# Patient Record
Sex: Female | Born: 1970 | Race: Black or African American | Hispanic: No | Marital: Single | Smoking: Never smoker
Health system: Southern US, Community
[De-identification: ages and names within clinical notes are randomized; demographics above are authoritative.]

## PROBLEM LIST (undated history)

## (undated) DIAGNOSIS — E785 Hyperlipidemia, unspecified: Secondary | ICD-10-CM

## (undated) DIAGNOSIS — I10 Essential (primary) hypertension: Secondary | ICD-10-CM

## (undated) DIAGNOSIS — K219 Gastro-esophageal reflux disease without esophagitis: Secondary | ICD-10-CM

## (undated) DIAGNOSIS — K2970 Gastritis, unspecified, without bleeding: Secondary | ICD-10-CM

## (undated) DIAGNOSIS — R7303 Prediabetes: Secondary | ICD-10-CM

## (undated) DIAGNOSIS — R202 Paresthesia of skin: Secondary | ICD-10-CM

## (undated) DIAGNOSIS — F419 Anxiety disorder, unspecified: Secondary | ICD-10-CM

## (undated) DIAGNOSIS — I499 Cardiac arrhythmia, unspecified: Secondary | ICD-10-CM

## (undated) DIAGNOSIS — G43909 Migraine, unspecified, not intractable, without status migrainosus: Secondary | ICD-10-CM

## (undated) DIAGNOSIS — D649 Anemia, unspecified: Secondary | ICD-10-CM

## (undated) DIAGNOSIS — Z86010 Personal history of colonic polyps: Secondary | ICD-10-CM

## (undated) DIAGNOSIS — J309 Allergic rhinitis, unspecified: Secondary | ICD-10-CM

## (undated) HISTORY — DX: Paresthesia of skin: R20.2

## (undated) HISTORY — DX: Gastro-esophageal reflux disease without esophagitis: K21.9

## (undated) HISTORY — DX: Migraine, unspecified, not intractable, without status migrainosus: G43.909

## (undated) HISTORY — DX: Anxiety disorder, unspecified: F41.9

## (undated) HISTORY — DX: Hyperlipidemia, unspecified: E78.5

## (undated) HISTORY — DX: Prediabetes: R73.03

## (undated) HISTORY — DX: Cardiac arrhythmia, unspecified: I49.9

## (undated) HISTORY — DX: Essential (primary) hypertension: I10

## (undated) HISTORY — DX: Gastritis, unspecified, without bleeding: K29.70

## (undated) HISTORY — DX: Allergic rhinitis, unspecified: J30.9

## (undated) HISTORY — DX: Anemia, unspecified: D64.9

## (undated) HISTORY — DX: Personal history of colonic polyps: Z86.010

---

## 1991-10-07 HISTORY — PX: BACK SURGERY: SHX140

## 2001-12-01 ENCOUNTER — Encounter: Admission: RE | Admit: 2001-12-01 | Discharge: 2002-03-01 | Payer: Self-pay | Admitting: Orthotics

## 2003-04-18 ENCOUNTER — Ambulatory Visit (HOSPITAL_COMMUNITY): Admission: RE | Admit: 2003-04-18 | Discharge: 2003-04-18 | Payer: Self-pay | Admitting: Gastroenterology

## 2003-04-18 ENCOUNTER — Encounter: Payer: Self-pay | Admitting: Gastroenterology

## 2003-05-25 ENCOUNTER — Encounter: Payer: Self-pay | Admitting: Gastroenterology

## 2003-05-25 ENCOUNTER — Encounter: Admission: RE | Admit: 2003-05-25 | Discharge: 2003-05-25 | Payer: Self-pay | Admitting: Gastroenterology

## 2003-06-01 ENCOUNTER — Ambulatory Visit (HOSPITAL_COMMUNITY): Admission: RE | Admit: 2003-06-01 | Discharge: 2003-06-01 | Payer: Self-pay | Admitting: Cardiology

## 2003-06-01 ENCOUNTER — Encounter: Payer: Self-pay | Admitting: Cardiology

## 2003-10-07 HISTORY — PX: ESOPHAGOGASTRODUODENOSCOPY: SHX1529

## 2005-12-30 ENCOUNTER — Emergency Department (HOSPITAL_COMMUNITY): Admission: EM | Admit: 2005-12-30 | Discharge: 2005-12-30 | Payer: Self-pay | Admitting: Emergency Medicine

## 2007-10-07 HISTORY — PX: OTHER SURGICAL HISTORY: SHX169

## 2009-11-09 ENCOUNTER — Encounter: Admission: RE | Admit: 2009-11-09 | Discharge: 2009-11-09 | Payer: Self-pay | Admitting: Internal Medicine

## 2010-09-10 ENCOUNTER — Other Ambulatory Visit
Admission: RE | Admit: 2010-09-10 | Discharge: 2010-09-10 | Payer: Self-pay | Source: Home / Self Care | Admitting: Radiology

## 2013-09-15 ENCOUNTER — Encounter: Payer: Self-pay | Admitting: Neurology

## 2013-09-19 ENCOUNTER — Encounter: Payer: Self-pay | Admitting: Neurology

## 2013-09-19 ENCOUNTER — Ambulatory Visit (INDEPENDENT_AMBULATORY_CARE_PROVIDER_SITE_OTHER): Payer: 59 | Admitting: Neurology

## 2013-09-19 ENCOUNTER — Encounter (INDEPENDENT_AMBULATORY_CARE_PROVIDER_SITE_OTHER): Payer: Self-pay

## 2013-09-19 VITALS — BP 136/89 | HR 96 | Ht 65.0 in | Wt 168.0 lb

## 2013-09-19 DIAGNOSIS — G43909 Migraine, unspecified, not intractable, without status migrainosus: Secondary | ICD-10-CM

## 2013-09-19 DIAGNOSIS — T7840XA Allergy, unspecified, initial encounter: Secondary | ICD-10-CM

## 2013-09-19 DIAGNOSIS — G43709 Chronic migraine without aura, not intractable, without status migrainosus: Secondary | ICD-10-CM | POA: Insufficient documentation

## 2013-09-19 NOTE — Progress Notes (Signed)
[facility]  PATIENT: MAKAIAH TERWILLIGER DOB: 1970/10/31  [place] is a 42 years old right-handed African American female, referred by her primary care physician Dr. Elvis Coil for evaluation of migraine headaches  She had a past history of migraine headaches since 2012, her typical migraine are bilateral retro-orbital areas severe pounding headache with associated light noise sensitivity, nauseous,  She was previously evaluated by [facility], in 2012, she was having headaches daily, she was put on atenolol, which has been very effective in preventing her headaches, over the past couple years, she rarely has any headaches, she has not receiving allergy treatment, was told that with beta blocker, she would have difficulty breathing if she has asthma attack. A week after she was taken off atenolol around November 19th 2014, she began to have frequent bilateral frontal area pressure, blurry vision, but no significant headaches,  In December 11th 2014, she was put on verapamil, there was no significant side effect,  REVIEW OF SYSTEMS: Full 14 system review of systems performed and notable only for asthma headaches allergy frequent infection,  ALLERGIES: Allergies  Allergen Reactions  . Hydrochlorothiazide     Low K  . Sulfa Antibiotics     Low platelet count    HOME MEDICATIONS: Outpatient Prescriptions Prior to Visit  Medication Sig Dispense Refill  . ALPRAZolam (XANAX) 0.25 MG tablet Take 0.25 mg by mouth as needed for anxiety.      Marland Kitchen aspirin 81 MG tablet Take 81 mg by mouth daily.      Marland Kitchen atenolol (TENORMIN) 50 MG tablet Take 50 mg by mouth 2 (two) times daily.      . calcium carbonate (OS-CAL) 600 MG TABS tablet Take 600 mg by mouth daily with breakfast.      . Cyanocobalamin (VITAMIN B 12 PO) Take 1 tablet by mouth daily.      . cyclobenzaprine (FLEXERIL) 5 MG tablet Take 5 mg by mouth. 1-2 tablets once a day if needed      . desloratadine  (CLARINEX) 5 MG tablet Take 5 mg by mouth daily.      Marland Kitchen desogestrel-ethinyl estradiol (KARIVA,AZURETTE,MIRCETTE) 0.15-0.02/0.01 MG (21/5) tablet Take 1 tablet by mouth daily.      Marland Kitchen esomeprazole (NEXIUM) 40 MG capsule Take 40 mg by mouth daily at 12 noon. 1-2 tablets      . ferrous sulfate 325 (65 FE) MG tablet Take 325 mg by mouth daily with breakfast.      . fluticasone (FLONASE) 50 MCG/ACT nasal spray Place 2 sprays into both nostrils as needed for allergies or rhinitis.      Marland Kitchen losartan (COZAAR) 100 MG tablet Take 100 mg by mouth daily.      . meclizine (ANTIVERT) 25 MG tablet Take 25 mg by mouth 3 (three) times daily as needed for dizziness.      . multivitamin-iron-minerals-folic acid (CENTRUM) chewable tablet Chew 1 tablet by mouth daily.      Marland Kitchen oxyCODONE-acetaminophen (PERCOCET) 10-325 MG per tablet Take 1 tablet by mouth every 4 (four) hours as needed for pain.      . polyethylene glycol (MIRALAX / GLYCOLAX) packet Take 17 g by mouth daily as needed.      . sodium chloride (OCEAN) 0.65 % nasal spray Place 2 sprays into the nose as needed for congestion.      Marland Kitchen spironolactone (ALDACTONE) 50 MG tablet Take 50 mg by mouth daily.      . traMADol (ULTRAM) 50 MG  tablet Take by mouth. 1 tablet as needed for pain       No facility-administered medications prior to visit.    PAST MEDICAL HISTORY: Past Medical History  Diagnosis Date  . Abnormal heart rhythm   . Hypertension, benign   . Migraine headache   . Acid reflux   . Mild hyperlipidemia   . Gastritis   . Hx of colonic polyps   . Pre-diabetes   . Anemia   . Anxiety   . Allergic rhinitis     PAST SURGICAL HISTORY: Past Surgical History  Procedure Laterality Date  . Back surgery  1993  . Orthoscopic surgery- both shoulders  2009  . Esophagogastroduodenoscopy  2005    FAMILY HISTORY: Family History  Problem Relation Age of Onset  . Cancer - Other Father   . Hypertension Mother     SOCIAL HISTORY:  History   Social  History  . Marital Status: Single    Spouse Name: N/A    Number of Children: 0  . 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 Smoking status: Never Smoker   . Smokeless tobacco: Never Used  . Alcohol Use: No  . Drug Use: No  . Sexual Activity: Not on file   Other Topics Concern  . Not on file   Social History Narrative   Patient is single and has no children.   Patient drinks one cup of coffee and soda- daily.   Right handed.   Education- College     PHYSICAL EXAM   Filed Vitals:   09/19/13 1441  BP: 136/89  Pulse: 96  Height: 5\' 5"  (1.651 m)  Weight: 168 lb (76.204 kg)    Not recorded    Body mass index is 27.96 kg/(m^2).   Generalized: In no acute distress  Neck: Supple, no carotid bruits   Cardiac: Regular rate rhythm  Pulmonary: Clear to auscultation bilaterally  Musculoskeletal: No deformity  Neurological examination  Mentation: Alert oriented to time, place, history taking, and causual conversation  Cranial nerve II-XII: Pupils were equal round reactive to light extraocular movements were full, Visual field were full on confrontational test. Bilateral fundi were sharp.  Facial sensation and strength were normal. Hearing was intact to finger rubbing bilaterally. Uvula tongue midline.  head turning and shoulder shrug and were normal and symmetric.Tongue protrusion into cheek strength was normal.  Motor: normal tone, bulk and strength.  Sensory: Intact to fine touch, pinprick, preserved vibratory sensation, and proprioception at toes.  Coordination: Normal finger to nose, heel-to-shin bilaterally there was no truncal ataxia  Gait: Rising up from seated position without assistance, normal stance, without trunk ataxia, moderate stride, good arm swing, smooth turning, able to perform tiptoe, and heel walking without difficulty.   Romberg signs: Negative  Deep tendon reflexes: Brachioradialis 2/2,  biceps 2/2, triceps 2/2, patellar 2/2, Achilles 2/2, plantar responses were flexor bilaterally.   DIAGNOSTIC DATA (LABS, IMAGING, TESTING) - I reviewed patient records, labs, notes, testing and imaging myself where available.  ASSESSMENT AND PLAN  42 years old Caucasian female, with past medical history of asthma, migraine headaches, responded very well to Tylenol treatment, now she rarely has migraine headaches, normal neurological examination. 1 she was given prescription of verapamil as preventive medications, continue current medications, if she continued to do very well, may consider tapering off preventive medications, 2.  Over-the-counter NSAIDs, Excedrin Migraine as abortive treatment 3 return to clinic with  Eber Jones in 6 months.        Levert Feinstein, M.D. Ph.D.  [facility] [address], [address] [place], [place] [address] [phone]

## 2014-02-13 ENCOUNTER — Encounter: Payer: Self-pay | Admitting: Nurse Practitioner

## 2014-03-20 ENCOUNTER — Ambulatory Visit: Payer: 59 | Admitting: Nurse Practitioner

## 2014-05-18 ENCOUNTER — Ambulatory Visit: Payer: 59 | Admitting: Nurse Practitioner

## 2014-06-16 ENCOUNTER — Other Ambulatory Visit: Payer: Self-pay | Admitting: Family Medicine

## 2014-06-16 DIAGNOSIS — G44019 Episodic cluster headache, not intractable: Secondary | ICD-10-CM

## 2014-06-30 ENCOUNTER — Other Ambulatory Visit: Payer: Self-pay | Admitting: Family Medicine

## 2014-06-30 DIAGNOSIS — G44011 Episodic cluster headache, intractable: Secondary | ICD-10-CM

## 2014-08-23 ENCOUNTER — Ambulatory Visit: Payer: 59 | Admitting: Nurse Practitioner

## 2014-08-23 ENCOUNTER — Telehealth: Payer: Self-pay | Admitting: Nurse Practitioner

## 2014-08-23 NOTE — Telephone Encounter (Signed)
No show for scheduled appointment 

## 2014-09-04 ENCOUNTER — Encounter: Payer: Self-pay | Admitting: Nurse Practitioner

## 2015-04-13 ENCOUNTER — Other Ambulatory Visit: Payer: Self-pay | Admitting: Family Medicine

## 2015-04-13 DIAGNOSIS — R519 Headache, unspecified: Secondary | ICD-10-CM

## 2015-04-13 DIAGNOSIS — R51 Headache: Principal | ICD-10-CM

## 2015-04-17 ENCOUNTER — Ambulatory Visit
Admission: RE | Admit: 2015-04-17 | Discharge: 2015-04-17 | Disposition: A | Discharge status: Home / Self Care | Payer: 59 | Source: Ambulatory Visit | Attending: Family Medicine | Admitting: Family Medicine

## 2015-04-17 DIAGNOSIS — R51 Headache: Principal | ICD-10-CM

## 2015-04-17 DIAGNOSIS — R519 Headache, unspecified: Secondary | ICD-10-CM

## 2016-05-14 ENCOUNTER — Ambulatory Visit (HOSPITAL_COMMUNITY)
Admission: EM | Admit: 2016-05-14 | Discharge: 2016-05-14 | Disposition: A | Discharge status: Home / Self Care | Payer: 59 | Attending: Family Medicine | Admitting: Family Medicine

## 2016-05-14 ENCOUNTER — Ambulatory Visit (INDEPENDENT_AMBULATORY_CARE_PROVIDER_SITE_OTHER): Payer: 59

## 2016-05-14 ENCOUNTER — Encounter (HOSPITAL_COMMUNITY): Payer: Self-pay | Admitting: *Deleted

## 2016-05-14 DIAGNOSIS — S92301A Fracture of unspecified metatarsal bone(s), right foot, initial encounter for closed fracture: Secondary | ICD-10-CM

## 2016-05-14 NOTE — ED Triage Notes (Signed)
Pt  Reports     Symptoms   Of    inj  To  Her    r   Foot    She  Reports   She  Has   Injured        The  Foot twice  This   Week      She reports    Pain  On  Weight        Bearing        And   On palpation

## 2016-05-14 NOTE — ED Provider Notes (Signed)
CSN: [id number]     Arrival date & time 05/14/16  1222 History   First MD Initiated Contact with Patient 05/14/16 1314     Chief Complaint  Patient presents with  . Foot Pain   (Consider location/radiation/quality/duration/timing/severity/associated sxs/prior Treatment) 45 year old females complaining of right foot pain for the past 2-3 days. Initially she dropped an object on the top of her right foot causing pain to the dorsum of the forefoot. She continued to ambulate with weightbearing and some discomfort for the next couple of days. Today she twisted her right foot and is complaining of pain with swelling and pain on weightbearing primarily to the dorsal lateral aspect of the foot.      Past Medical History:  Diagnosis Date  . Abnormal heart rhythm   . Acid reflux   . Allergic rhinitis   . Anemia   . Anxiety   . Gastritis   . Hx of colonic polyps   . Hypertension, benign   . Migraine headache   . Mild hyperlipidemia   . Pre-diabetes    Past Surgical History:  Procedure Laterality Date  . BACK SURGERY  1993  . ESOPHAGOGASTRODUODENOSCOPY  2005  . orthoscopic surgery- both shoulders  2009   Family History  Problem Relation Age of Onset  . Cancer - Other Father   . Hypertension Mother    Social History  Substance Use Topics  . Smoking status: Never Smoker  . Smokeless tobacco: Never Used  . Alcohol use No   OB History    No data available     Review of Systems  Constitutional: Negative for activity change, chills and fever.  HENT: Negative.   Respiratory: Negative.   Musculoskeletal: Negative for back pain, joint swelling, myalgias, neck pain and neck stiffness.       As per HPI  Skin: Negative for color change, pallor and rash.  Neurological: Negative.     Allergies  Hydrochlorothiazide and Sulfa antibiotics  Home Medications   Prior to Admission medications   Medication Sig Start Date End Date Taking? Authorizing Provider  ALPRAZolam Prudy Feeler(XANAX) 0.25 MG  tablet Take 0.25 mg by mouth as needed for anxiety.    Historical Provider, MD  aspirin 81 MG tablet Take 81 mg by mouth daily.    Historical Provider, MD  atenolol (TENORMIN) 50 MG tablet Take 50 mg by mouth 2 (two) times daily.    Historical Provider, MD  calcium carbonate (OS-CAL) 600 MG TABS tablet Take 600 mg by mouth daily with breakfast.    Historical Provider, MD  Cyanocobalamin (VITAMIN B 12 PO) Take 1 tablet by mouth daily.    Historical Provider, MD  cyclobenzaprine (FLEXERIL) 5 MG tablet Take 5 mg by mouth. 1-2 tablets once a day if needed    Historical Provider, MD  desloratadine (CLARINEX) 5 MG tablet Take 5 mg by mouth daily.    Historical Provider, MD  desogestrel-ethinyl estradiol (KARIVA,AZURETTE,MIRCETTE) 0.15-0.02/0.01 MG (21/5) tablet Take 1 tablet by mouth daily.    Historical Provider, MD  esomeprazole (NEXIUM) 40 MG capsule Take 40 mg by mouth daily at 12 noon. 1-2 tablets    Historical Provider, MD  ferrous sulfate 325 (65 FE) MG tablet Take 325 mg by mouth daily with breakfast.    Historical Provider, MD  fluticasone (FLONASE) 50 MCG/ACT nasal spray Place 2 sprays into both nostrils as needed for allergies or rhinitis.    Historical Provider, MD  HYDROcodone-acetaminophen (NORCO) 10-325 MG per tablet Take 325 tablets by mouth  daily. 09/05/13   Historical Provider, MD  losartan (COZAAR) 100 MG tablet Take 100 mg by mouth daily.    Historical Provider, MD  meclizine (ANTIVERT) 25 MG tablet Take 25 mg by mouth 3 (three) times daily as needed for dizziness.    Historical Provider, MD  multivitamin-iron-minerals-folic acid (CENTRUM) chewable tablet Chew 1 tablet by mouth daily.    Historical Provider, MD  oxyCODONE-acetaminophen (PERCOCET) 10-325 MG per tablet Take 1 tablet by mouth every 4 (four) hours as needed for pain.    Historical Provider, MD  polyethylene glycol (MIRALAX / GLYCOLAX) packet Take 17 g by mouth daily as needed.    Historical Provider, MD  pravastatin  (PRAVACHOL) 20 MG tablet Take 20 mg by mouth daily. 08/30/13   Historical Provider, MD  sodium chloride (OCEAN) 0.65 % nasal spray Place 2 sprays into the nose as needed for congestion.    Historical Provider, MD  spironolactone (ALDACTONE) 50 MG tablet Take 50 mg by mouth daily.    Historical Provider, MD  traMADol (ULTRAM) 50 MG tablet Take by mouth. 1 tablet as needed for pain    Historical Provider, MD  verapamil (CALAN-SR) 120 MG CR tablet Take 120 mg by mouth daily. 08/18/13   Historical Provider, MD  ZOHYDRO ER 20 MG CP12 20 mg daily. 09/04/13   Historical Provider, MD   Meds Ordered and Administered this Visit  Medications - No data to display  BP 120/72 (BP Location: Right Arm)   Pulse 72   Temp 98.6 F (37 C) (Oral)   Resp 18   SpO2 100%  No data found.   Physical Exam  Constitutional: She is oriented to person, place, and time. She appears well-developed and well-nourished. No distress.  HENT:  Head: Normocephalic and atraumatic.  Eyes: EOM are normal.  Neck: Normal range of motion. Neck supple.  Cardiovascular: Normal rate.   Pulmonary/Chest: Effort normal.  Musculoskeletal: Normal range of motion. She exhibits edema and tenderness. She exhibits no deformity.  Right foot demonstrating full range of motion of the ankle and foot. No deformity. Minor swelling and tenderness along the dorsal lateral aspect greatest over proximal 4th and 5th MT. Distal neurovascular motor sensory intact.  Lymphadenopathy:    She has no cervical adenopathy.  Neurological: She is alert and oriented to person, place, and time. No cranial nerve deficit.  Skin: Skin is warm and dry. No erythema.  Psychiatric: She has a normal mood and affect.  Nursing note and vitals reviewed.   Urgent Care Course   Clinical Course    Procedures (including critical care time)  Labs Review Labs Reviewed - No data to display  Imaging Review Dg Foot Complete Right  Result Date: 05/14/2016 CLINICAL  DATA:  Pain laterally EXAM: RIGHT FOOT COMPLETE - 3+ VIEW COMPARISON:  None. FINDINGS: There is a slightly distracted fracture through the base of the right fifth metatarsal with adjacent soft tissue swelling. Tarsal-metatarsal alignment is normal. Joint spaces appear normal. IMPRESSION: Slightly distracted fracture through the base of the right fifth metatarsal Electronically Signed   By: Dwyane Dee M.D.   On: 05/14/2016 13:51     Visual Acuity Review  Right Eye Distance:   Left Eye Distance:   Bilateral Distance:    Right Eye Near:   Left Eye Near:    Bilateral Near:         MDM   1. Metatarsal fracture, right, closed, initial encounter    RICE Bulky Jones wrap, crutches. No wt  bearing Ice, elevation, wear wrap for support and no weight bearing. Use crutches. Follow with orthopedic surgeon, call for appointment.     Hayden Rasmussen, NP 05/14/16 1413

## 2016-07-07 ENCOUNTER — Encounter: Payer: Self-pay | Admitting: Neurology

## 2016-07-07 ENCOUNTER — Ambulatory Visit (INDEPENDENT_AMBULATORY_CARE_PROVIDER_SITE_OTHER): Payer: 59 | Admitting: Neurology

## 2016-07-07 VITALS — BP 128/90 | HR 83 | Ht 65.0 in | Wt 185.2 lb

## 2016-07-07 DIAGNOSIS — G43709 Chronic migraine without aura, not intractable, without status migrainosus: Secondary | ICD-10-CM

## 2016-07-07 DIAGNOSIS — R202 Paresthesia of skin: Secondary | ICD-10-CM | POA: Insufficient documentation

## 2016-07-07 MED ORDER — DICLOFENAC POTASSIUM(MIGRAINE) 50 MG PO PACK: 50.0000 mg | PACK | ORAL | 11 refills | Status: AC | PRN

## 2016-07-07 NOTE — Progress Notes (Signed)
PATIENT: Michelle Brandt DOB: 1971-02-01  Chief Complaint  Patient presents with  . Numbness    Reports intermittent numbness on the left side of her face, left arm and left leg.  Symptoms have been present for 2-3 months.  . Migraine    Last seen for migraines in 09/2013.  She only gets one every few months.  Ibuprofen is helpful for pain relief.       HISTORICAL  Michelle Brandt is a 45 years old right-handed female, seen in refer by her primary care physician Dr. Mila Palmer for evaluation of chronic migraine, intermittent episode of left-sided paresthesia July 07 2016  I reviewed and summarized the referring note, she had a history of chest pain, she attributed to stress in the past, no significant coronary artery disease, hypertension, chronic migraine headaches, was treated at [facility] in the past, she could not tolerate Topamax, with confusion, dizziness, she is on two agent, atenolol, verapamil as migraine prevention also hypertension treatment  She reported history of migraine for many years, intermittent, sometimes few days in a row, the other times just couple times each months, she is under a lot of stress now, she lives with her elderly grandmother 32 years old, who had frequent hospital admission recently, she often has to stay up late and working full-time doing day,  Since 2017, she reported 3 episode of transient shadowing onset left numbness involving her left face, arm, trunk, leg, lasting for 1 day, most recent episode was in September 2017, there was no associated weakness, no visual loss, no significant headache, 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  We have personally reviewed CAT scan in June 2016 that was normal  REVIEW OF SYSTEMS: Full 14 system review of systems performed and notable only for dizziness, numbness, anxiety, back pain, rash  ALLERGIES: Allergies  Allergen Reactions  . Hydrochlorothiazide     Low K  . Sulfa  Antibiotics     Low platelet count  . Cefpodoxime Palpitations    Dizziness  . Ciprofloxacin Nausea And Vomiting    HOME MEDICATIONS: Current Outpatient Prescriptions  Medication Sig Dispense Refill  . ALPRAZolam (XANAX) 0.25 MG tablet Take 0.25 mg by mouth as needed for anxiety.    Marland Kitchen aspirin 81 MG tablet Take 81 mg by mouth daily.    Marland Kitchen atenolol (TENORMIN) 50 MG tablet Take 50 mg by mouth 2 (two) times daily.    . calcium carbonate (OS-CAL) 600 MG TABS tablet Take 600 mg by mouth daily with breakfast.    . Cyanocobalamin (VITAMIN B 12 PO) Take 1 tablet by mouth daily.    . cyclobenzaprine (FLEXERIL) 5 MG tablet Take 5 mg by mouth. 1-2 tablets once a day if needed    . desloratadine (CLARINEX) 5 MG tablet Take 5 mg by mouth daily.    Marland Kitchen desogestrel-ethinyl estradiol (KARIVA,AZURETTE,MIRCETTE) 0.15-0.02/0.01 MG (21/5) tablet Take 1 tablet by mouth daily.    Marland Kitchen esomeprazole (NEXIUM) 40 MG capsule Take 40 mg by mouth daily at 12 noon. 1-2 tablets    . ferrous sulfate 325 (65 FE) MG tablet Take 325 mg by mouth daily with breakfast.    . fluticasone (FLONASE) 50 MCG/ACT nasal spray Place 2 sprays into both nostrils as needed for allergies or rhinitis.    Marland Kitchen gabapentin (NEURONTIN) 100 MG capsule 2 (two) times daily.    Marland Kitchen losartan (COZAAR) 100 MG tablet Take 100 mg by mouth daily.    Marland Kitchen  meclizine (ANTIVERT) 25 MG tablet Take 25 mg by mouth 3 (three) times daily as needed for dizziness.    . multivitamin-iron-minerals-folic acid (CENTRUM) chewable tablet Chew 1 tablet by mouth daily.    . Oxycodone HCl 10 MG TABS 10 mg every 8 (eight) hours as needed.    . polyethylene glycol (MIRALAX / GLYCOLAX) packet Take 17 g by mouth daily as needed.    . pravastatin (PRAVACHOL) 20 MG tablet Take 20 mg by mouth daily.    . sodium chloride (OCEAN) 0.65 % nasal spray Place 2 sprays into the nose as needed for congestion.    Marland Kitchen spironolactone (ALDACTONE) 50 MG tablet Take 50 mg by mouth daily.    . traMADol  (ULTRAM) 50 MG tablet Take by mouth. 1 tablet as needed for pain    . verapamil (CALAN-SR) 120 MG CR tablet Take 120 mg by mouth daily.    Marland Kitchen ZOHYDRO ER 20 MG CP12 20 mg daily.     No current facility-administered medications for this visit.     PAST MEDICAL HISTORY: Past Medical History:  Diagnosis Date  . Abnormal heart rhythm   . Acid reflux   . Allergic rhinitis   . Anemia   . Anxiety   . Gastritis   . Hx of colonic polyps   . Hypertension, benign   . Migraine headache   . Mild hyperlipidemia   . Paresthesia   . Pre-diabetes     PAST SURGICAL HISTORY: Past Surgical History:  Procedure Laterality Date  . BACK SURGERY  1993  . ESOPHAGOGASTRODUODENOSCOPY  2005  . orthoscopic surgery- both shoulders  2009    FAMILY HISTORY: Family History  Problem Relation Age of Onset  . Cancer - Other Father   . Hypertension Mother     SOCIAL HISTORY:  Social History   Social History  . Marital status: Single    Spouse name: N/A  . Number of children: 0  . Years of education: college   Occupational History  . [employer]   Social History Main Topics  . Smoking status: Never Smoker  . Smokeless tobacco: Never Used  . Alcohol use No  . Drug use: No  . Sexual activity: Not on file   Other Topics Concern  . Not on file   Social History Narrative   Patient is single and has no children.   Patient drinks one cup of coffee and soda- daily.   Right handed.   Education- College     PHYSICAL EXAM   Vitals:   07/07/16 1606  BP: 128/90  Pulse: 83  Weight: 185 lb 4 oz (84 kg)  Height: 5\' 5"  (1.651 m)    Not recorded      Body mass index is 30.83 kg/m.  PHYSICAL EXAMNIATION:  Gen: NAD, conversant, well nourised, obese, well groomed                     Cardiovascular: Regular rate rhythm, no peripheral edema, warm, nontender. Eyes: Conjunctivae clear without exudates or hemorrhage Neck: Supple, no carotid bruise. Pulmonary: Clear to auscultation  bilaterally   NEUROLOGICAL EXAM:  MENTAL STATUS: Speech:    Speech is normal; fluent and spontaneous with normal comprehension.  Cognition:     Orientation to time, place and person     Normal recent and remote memory     Normal Attention span and concentration     Normal Language, naming, repeating,spontaneous speech     Fund of  knowledge   CRANIAL NERVES: CN II: Visual fields are full to confrontation. Fundoscopic exam is normal with sharp discs and no vascular changes. Pupils are round equal and briskly reactive to light. CN III, IV, VI: extraocular movement are normal. No ptosis. CN V: Facial sensation is intact to pinprick in all 3 divisions bilaterally. Corneal responses are intact.  CN VII: Face is symmetric with normal eye closure and smile. CN VIII: Hearing is normal to rubbing fingers CN IX, X: Palate elevates symmetrically. Phonation is normal. CN XI: Head turning and shoulder shrug are intact CN XII: Tongue is midline with normal movements and no atrophy.  MOTOR: There is no pronator drift of out-stretched arms. Muscle bulk and tone are normal. Muscle strength is normal.  REFLEXES: Reflexes are 2+ and symmetric at the biceps, triceps, knees, and ankles. Plantar responses are flexor.  SENSORY: Intact to light touch, pinprick, positional sensation and vibratory sensation are intact in fingers and toes.  COORDINATION: Rapid alternating movements and fine finger movements are intact. There is no dysmetria on finger-to-nose and heel-knee-shin.    GAIT/STANCE: Posture is normal. Gait is steady with normal steps, base, arm swing, and turning. Heel and toe walking are normal. Tandem gait is normal.  Romberg is absent.   DIAGNOSTIC DATA (LABS, IMAGING, TESTING) - I reviewed patient records, labs, notes, testing and imaging myself where available.   ASSESSMENT AND PLAN  Michelle Brandt is a 45 y.o. female   Chronic migraine  On preventive medication, atenolol,  verapamil  Cambia as needed New-onset left-sided paresthesia  MRI of the brain to rule out right hemisphere pathology   Michelle Brandt, M.D. Ph.D.  [facility] [address], [address] [place], [place] [address] Ph: [phone] Fax: [phone]  CC: Referring Provider

## 2016-07-09 ENCOUNTER — Telehealth: Payer: Self-pay | Admitting: *Deleted

## 2016-07-09 NOTE — Telephone Encounter (Signed)
Her insurance plan will not cover Cambia.  Called patient who states she has never tried any other rescue medications for her migraines.

## 2016-07-10 MED ORDER — SUMATRIPTAN SUCCINATE 25 MG PO TABS: 25.0000 mg | ORAL_TABLET | ORAL | 6 refills | Status: AC | PRN

## 2016-07-10 NOTE — Addendum Note (Signed)
Addended by: Levert FeinsteinYAN, Emmajean Ratledge on: 07/10/2016 08:40 AM   Modules accepted: Orders

## 2016-07-10 NOTE — Telephone Encounter (Signed)
Michelle DikeJennifer called back and they do not have any records on the pt.

## 2016-07-10 NOTE — Telephone Encounter (Addendum)
Spoke to patient - states she has frequent heartburn. She has never been diagnosed with coronary artery disease.  Dr. Terrace ArabiaYan has provided her a low dose sumatriptan.  Patient is agreeable to this medication.

## 2016-07-10 NOTE — Telephone Encounter (Addendum)
Patient never tried triptan treatment in the past, with history of chest pain, she reported is due to her excessive stress, will try low-dose Imitrex 25 mg as needed may take it together with ibuprofen,  Please also double check she does not have a history of coronary artery disease, no longer has angina

## 2016-07-30 ENCOUNTER — Other Ambulatory Visit: Payer: 59

## 2016-07-31 ENCOUNTER — Telehealth: Payer: Self-pay | Admitting: Neurology

## 2016-07-31 NOTE — Telephone Encounter (Signed)
Patient returned Emily's call regarding scheduling MRI.

## 2016-08-05 NOTE — Telephone Encounter (Signed)
Patient no showed previous apt. Called her back and she did not answer and there was no option for VM.

## 2016-08-11 ENCOUNTER — Ambulatory Visit (INDEPENDENT_AMBULATORY_CARE_PROVIDER_SITE_OTHER): Payer: 59

## 2016-08-11 ENCOUNTER — Encounter (INDEPENDENT_AMBULATORY_CARE_PROVIDER_SITE_OTHER): Payer: Self-pay | Admitting: Orthopaedic Surgery

## 2016-08-11 ENCOUNTER — Ambulatory Visit (INDEPENDENT_AMBULATORY_CARE_PROVIDER_SITE_OTHER): Payer: 59 | Admitting: Orthopaedic Surgery

## 2016-08-11 DIAGNOSIS — S92351K Displaced fracture of fifth metatarsal bone, right foot, subsequent encounter for fracture with nonunion: Secondary | ICD-10-CM

## 2016-08-11 NOTE — Progress Notes (Signed)
   Office Visit Note   Patient: Michelle Brandt           Date of Birth: 09/23/1971           MRN: [id number] Visit Date: 08/11/2016              Requested by: Mila PalmerSharon Wolters, MD [address] [place], [place] [address] PCP: Emeterio ReeveWOLTERS,SHARON A, MD   Assessment & Plan: Visit Diagnoses:  1. 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 Excision of the fragment - we agreed to continue conservative treatment and custom orthotic - she's not to the point that she would want surgery  Follow-Up Instructions: Return if symptoms worsen or fail to improve.   Orders:  Orders Placed This Encounter  Procedures  . XR Foot Complete Right   No orders of the defined types were placed in this encounter.     Procedures: No procedures performed   Clinical Data: No additional findings.   Subjective: Chief Complaint  Patient presents with  . Right Foot - Pain, Fracture, Follow-up    3 month f/u visit for 5th MT base fx.  Still has constant 5/10 pain.  Wearing regular shoes now.  Pain doesn't radiate.    Review of Systems   Objective: Vital Signs: There were no vitals taken for this visit.  Physical Exam  Right Ankle Exam   Comments:  Mild tenderness to base of 5th MT.  No swelling or skin changes.      Specialty Comments:  No specialty comments available.  Imaging: No results found.   PMFS History: Patient Active Problem List   Diagnosis Date Noted  . Paresthesia 07/07/2016  . Chronic migraine w/o aura, not intractable, w/o stat migr 09/19/2013  . Allergy 09/19/2013   Past Medical History:  Diagnosis Date  . Abnormal heart rhythm   . Acid reflux   . Allergic rhinitis   . Anemia   . Anxiety   . Gastritis   . Hx of colonic polyps   . Hypertension, benign   . Migraine headache   . Mild hyperlipidemia    . Paresthesia   . Pre-diabetes     Family History  Problem Relation Age of Onset  . Cancer - Other Father   . Hypertension Mother     Past Surgical History:  Procedure Laterality Date  . BACK SURGERY  1993  . ESOPHAGOGASTRODUODENOSCOPY  2005  . orthoscopic surgery- both shoulders  2009   Social History   Occupational History  . [employer]   Social History Main Topics  . Smoking status: Never Smoker  . Smokeless tobacco: Never Used  . Alcohol use No  . Drug use: No  . Sexual activity: Not on file

## 2016-09-15 ENCOUNTER — Ambulatory Visit: Payer: 59 | Admitting: Neurology

## 2016-09-16 ENCOUNTER — Encounter: Payer: Self-pay | Admitting: Neurology

## 2016-12-09 ENCOUNTER — Ambulatory Visit (INDEPENDENT_AMBULATORY_CARE_PROVIDER_SITE_OTHER): Payer: 59 | Admitting: Orthopaedic Surgery

## 2016-12-09 ENCOUNTER — Ambulatory Visit (INDEPENDENT_AMBULATORY_CARE_PROVIDER_SITE_OTHER): Payer: 59

## 2016-12-09 ENCOUNTER — Encounter (INDEPENDENT_AMBULATORY_CARE_PROVIDER_SITE_OTHER): Payer: Self-pay | Admitting: Orthopaedic Surgery

## 2016-12-09 DIAGNOSIS — S8265XA Nondisplaced fracture of lateral malleolus of left fibula, initial encounter for closed fracture: Secondary | ICD-10-CM | POA: Diagnosis not present

## 2016-12-09 DIAGNOSIS — M79671 Pain in right foot: Secondary | ICD-10-CM | POA: Diagnosis not present

## 2016-12-09 NOTE — Progress Notes (Signed)
Office Visit Note   Patient: Michelle Brandt           Date of Birth: 05/25/1971           MRN: [id number] Visit Date: 12/09/2016              Requested by: Mila PalmerSharon Wolters, MD [address] [place], [place] [address] PCP: Emeterio ReeveWOLTERS,SHARON A, MD   Assessment & Plan: Visit Diagnoses:  1. Closed nondisplaced fracture of lateral malleolus of left fibula, initial encounter   2. Pain in right foot     Plan: We will plan on treating the fracture nonoperatively with a cam boot for 4 weeks. Right foot is not bothering her. Does show nonunion of the base of fifth metatarsal fracture. I'll see her back in 4 weeks with repeat 2 view x-rays of the left ankle once this plate putting her in physical therapy at that time and also getting her an ASO brace. Questions encouraged and answered.  Follow-Up Instructions: Return in about 4 weeks (around 01/06/2017).   Orders:  Orders Placed This Encounter  Procedures  . XR Ankle Complete Left  . XR Foot Complete Right   No orders of the defined types were placed in this encounter.     Procedures: No procedures performed   Clinical Data: No additional findings.   Subjective: Chief Complaint  Patient presents with  . Left Ankle - Pain    Patient is a 46 year old female comes in with a new injury of her left ankle. She twisted her ankle and she was evaluated at an outside ER which showed a nondisplaced Weber a ankle fracture. She's been wearing her regular shoes. She is limping. She is taking oxycodone for her back. She is currently in pain management. Her right foot is not bothering her that badly but she is requesting a foot x-ray.    Review of Systems  Constitutional: Negative.   HENT: Negative.   Eyes: Negative.   Respiratory: Negative.   Cardiovascular: Negative.   Endocrine: Negative.   Musculoskeletal: Negative.   Neurological: Negative.   Hematological: Negative.   Psychiatric/Behavioral: Negative.   All other  systems reviewed and are negative.    Objective: Vital Signs: There were no vitals taken for this visit.  Physical Exam  Constitutional: She is oriented to person, place, and time. She appears well-developed and well-nourished.  Pulmonary/Chest: Effort normal.  Neurological: She is alert and oriented to person, place, and time.  Skin: Skin is warm. Capillary refill takes less than 2 seconds.  Psychiatric: She has a normal mood and affect. Her behavior is normal. Judgment and thought content normal.  Nursing note and vitals reviewed.   Ortho Exam Exam of the left ankle shows mild swelling. There is crepitus of the distal fibula. This is tender to palpation. She is neurovascular intact. Right foot exam is benign. Specialty Comments:  No specialty comments available.  Imaging: No results found.   PMFS History: Patient Active Problem List   Diagnosis Date Noted  . Closed nondisplaced fracture of lateral malleolus of left fibula 12/09/2016  . Paresthesia 07/07/2016  . Chronic migraine w/o aura, not intractable, w/o stat migr 09/19/2013  . Allergy 09/19/2013   Past Medical History:  Diagnosis Date  . Abnormal heart rhythm   . Acid reflux   . Allergic rhinitis   . Anemia   . Anxiety   . Gastritis   . Hx of colonic polyps   . Hypertension, benign   .  Migraine headache   . Mild hyperlipidemia   . Paresthesia   . Pre-diabetes     Family History  Problem Relation Age of Onset  . Cancer - Other Father   . Hypertension Mother     Past Surgical History:  Procedure Laterality Date  . BACK SURGERY  1993  . ESOPHAGOGASTRODUODENOSCOPY  2005  . orthoscopic surgery- both shoulders  2009   Social History   Occupational History  . [employer]   Social History Main Topics  . Smoking status: Never Smoker  . Smokeless tobacco: Never Used  . Alcohol use No  . Drug use: No  . Sexual activity: Not on file

## 2017-01-12 ENCOUNTER — Ambulatory Visit (INDEPENDENT_AMBULATORY_CARE_PROVIDER_SITE_OTHER): Payer: 59

## 2017-01-12 ENCOUNTER — Ambulatory Visit (INDEPENDENT_AMBULATORY_CARE_PROVIDER_SITE_OTHER): Payer: 59 | Admitting: Orthopaedic Surgery

## 2017-01-12 DIAGNOSIS — M79671 Pain in right foot: Secondary | ICD-10-CM

## 2017-01-12 DIAGNOSIS — M25571 Pain in right ankle and joints of right foot: Secondary | ICD-10-CM | POA: Diagnosis not present

## 2017-01-12 DIAGNOSIS — M25572 Pain in left ankle and joints of left foot: Secondary | ICD-10-CM

## 2017-01-12 DIAGNOSIS — S92351K Displaced fracture of fifth metatarsal bone, right foot, subsequent encounter for fracture with nonunion: Secondary | ICD-10-CM

## 2017-01-12 NOTE — Progress Notes (Signed)
   Office Visit Note   Patient: Michelle Brandt           Date of Birth: [birth year]           MRN: [id number] Visit Date: 01/12/2017              Requested by: Mila Palmer, MD [address] [place], [place] [address] PCP: Emeterio Reeve, MD   Assessment & Plan: Visit Diagnoses:  1. Acute bilateral ankle pain   2. Pain in right foot   3. 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 Begin physical therapy for both ankles for strengthening and proprioceptive exercises. Follow-up in 6 weeks for recheck. No x-rays needed unless she is having problems.  Follow-Up Instructions: Return in about 6 weeks (around 02/23/2017).   Orders:  Orders Placed This Encounter  Procedures  . XR Ankle 2 Views Right  . XR Ankle 2 Views Left   No orders of the defined types were placed in this encounter.     Procedures: No procedures performed   Clinical Data: No additional findings.   Subjective: Chief Complaint  Patient presents with  . Right Ankle - Pain  . Left Ankle - Pain, Follow-up    Patient comes back today for follow-up of her left distal fibula fracture and new problem of right ankle sprain. Her right ankle hurts laterally but overall this is better. Her left ankle still has some mild swelling. She's been ambulating in a fracture boot.    Review of Systems   Objective: Vital Signs: There were no vitals taken for this visit.  Physical Exam  Ortho Exam Right ankle exam shows no swelling. She has mild tenderness along the posterior lateral aspect of the ankle. The peroneal tendons are stable. Ankle joint is stable. Subtalar motion does not cause pain. Left ankle exam shows mild swelling over the distal fibula. Mildly tender to palpation. Otherwise benign exam. Specialty Comments:  No specialty  comments available.  Imaging: No results found.   PMFS History: Patient Active Problem List   Diagnosis Date Noted  . Closed nondisplaced fracture of lateral malleolus of left fibula 12/09/2016  . Paresthesia 07/07/2016  . Chronic migraine w/o aura, not intractable, w/o stat migr 09/19/2013  . Allergy 09/19/2013   Past Medical History:  Diagnosis Date  . Abnormal heart rhythm   . Acid reflux   . Allergic rhinitis   . Anemia   . Anxiety   . Gastritis   . Hx of colonic polyps   . Hypertension, benign   . Migraine headache   . Mild hyperlipidemia   . Paresthesia   . Pre-diabetes     Family History  Problem Relation Age of Onset  . Cancer - Other Father   . Hypertension Mother     Past Surgical History:  Procedure Laterality Date  . BACK SURGERY  1993  . ESOPHAGOGASTRODUODENOSCOPY  2005  . orthoscopic surgery- both shoulders  2009   Social History   Occupational History  . [employer]   Social History Main Topics  . Smoking status: Never Smoker  . Smokeless tobacco: Never Used  . Alcohol use No  . Drug use: No  . Sexual activity: Not on file

## 2017-02-23 ENCOUNTER — Ambulatory Visit (INDEPENDENT_AMBULATORY_CARE_PROVIDER_SITE_OTHER): Payer: 59 | Admitting: Orthopaedic Surgery

## 2017-02-27 ENCOUNTER — Telehealth (INDEPENDENT_AMBULATORY_CARE_PROVIDER_SITE_OTHER): Payer: Self-pay | Admitting: Orthopaedic Surgery

## 2017-02-27 NOTE — Telephone Encounter (Signed)
Patient called asked if she can get a brace for her Rt Ankle. Patient advised she can pick the brace up. The number to contact patient is [phone]

## 2017-03-03 NOTE — Telephone Encounter (Signed)
Is this ok?

## 2017-03-03 NOTE — Telephone Encounter (Signed)
Yes ASO

## 2017-03-04 NOTE — Telephone Encounter (Signed)
Patient will come in today before 12 to get a new brace

## 2017-03-16 ENCOUNTER — Ambulatory Visit (INDEPENDENT_AMBULATORY_CARE_PROVIDER_SITE_OTHER): Payer: 59 | Admitting: Orthopaedic Surgery

## 2017-03-21 IMAGING — CT CT HEAD W/O CM
1 series · 16 of 30 positions shown, 20 images · non-contrast
Comparison: The ventricular scratch CT brain scan of 07/31/2011

CLINICAL DATA: Generalized headaches intermittently over the last
few months, no trauma

EXAM:
CT HEAD WITHOUT CONTRAST
TECHNIQUE: Contiguous axial images were obtained from the base of the skull
through the vertex without intravenous contrast.

[Series 2: head w/(date) · axial · 0.47mm/px · z∈[-131,+9]mm · 16 of 32 slices shown, 20 images]
[im 2/32  brain]
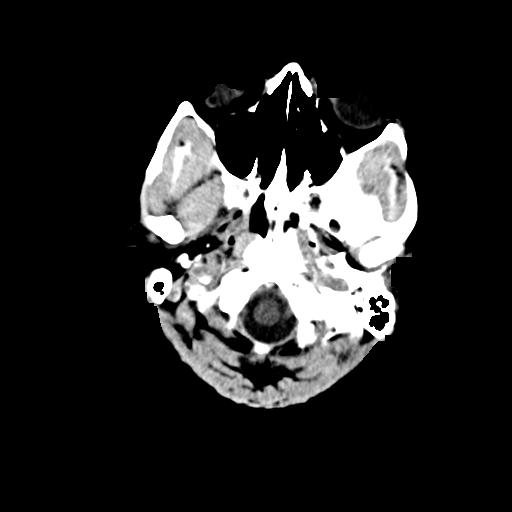
[im 2/32  bone]
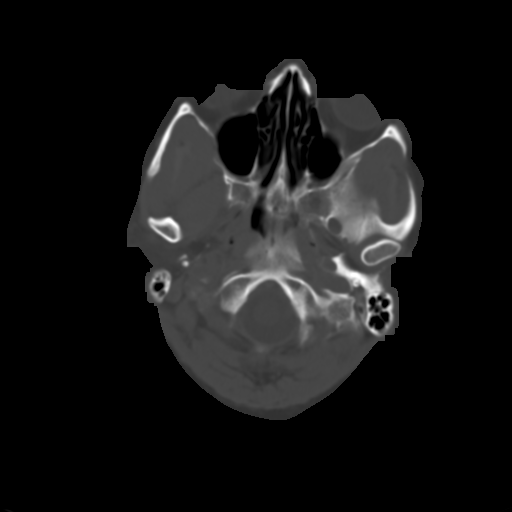
[im 4/32  brain]
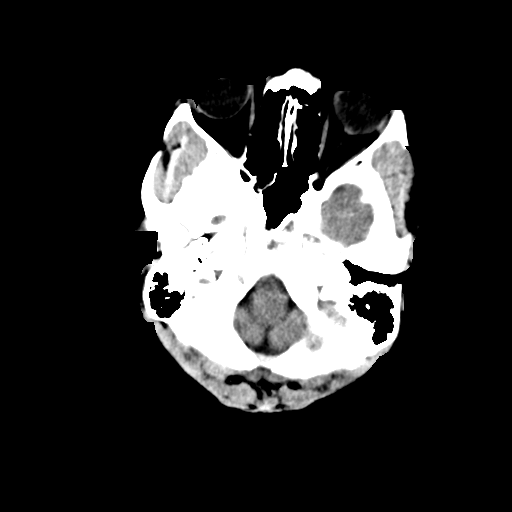
[im 6/32  brain]
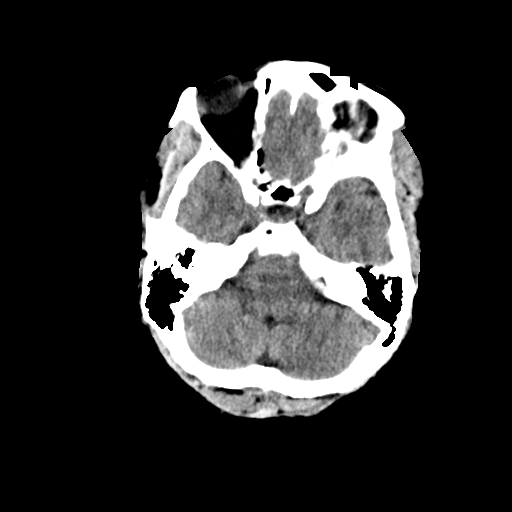
[im 8/32  brain]
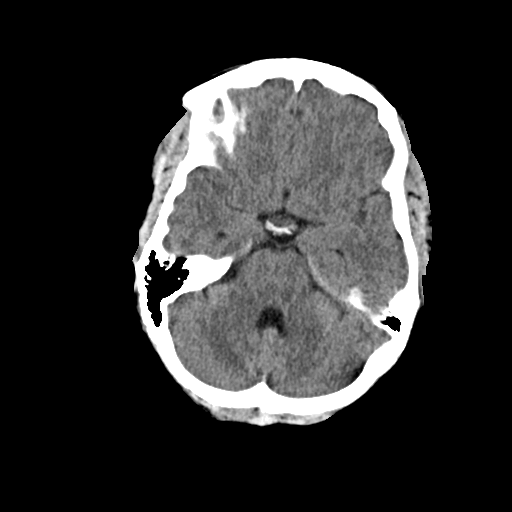
[im 9/32  brain]
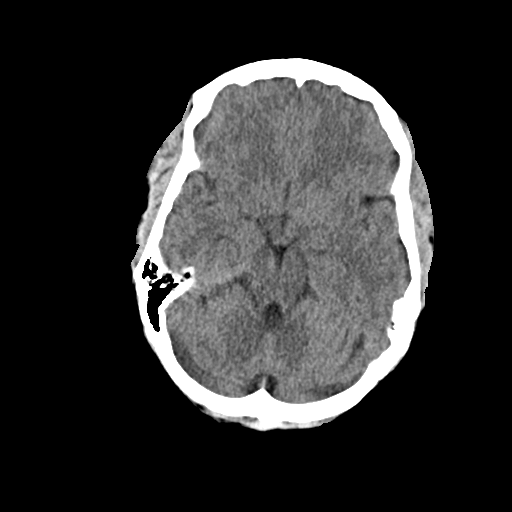
[im 9/32  bone]
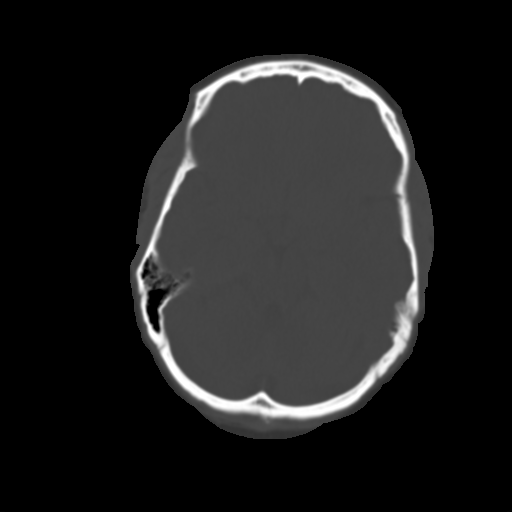
[im 11/32  brain]
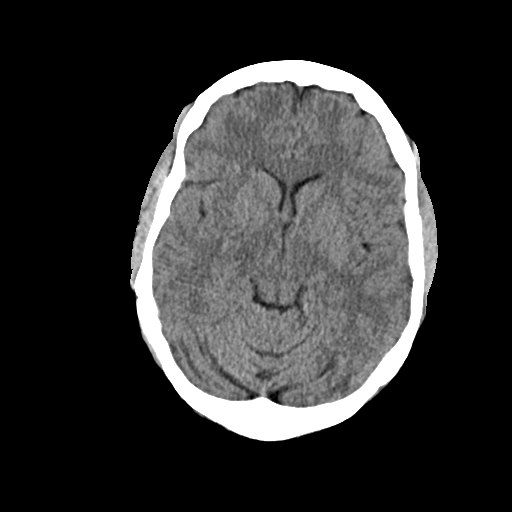
[im 13/32  brain]
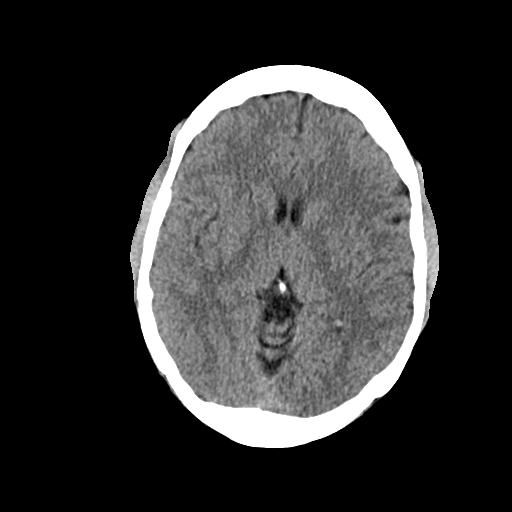
[im 15/32  brain]
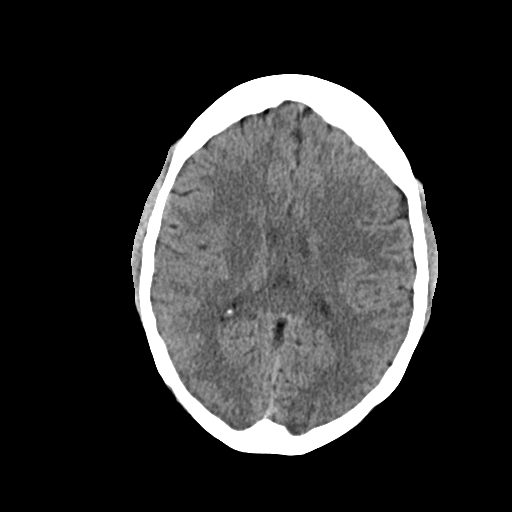
[im 17/32  brain]
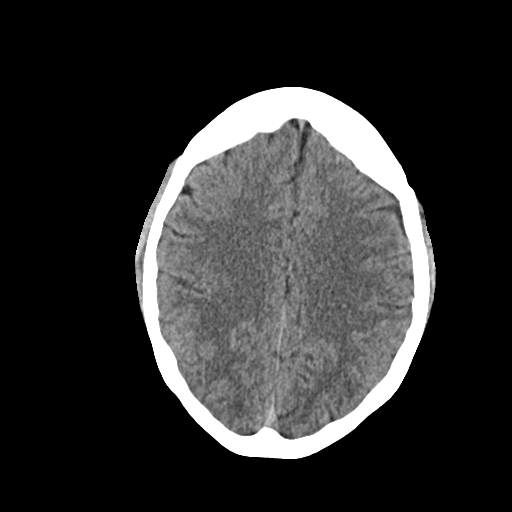
[im 17/32  bone]
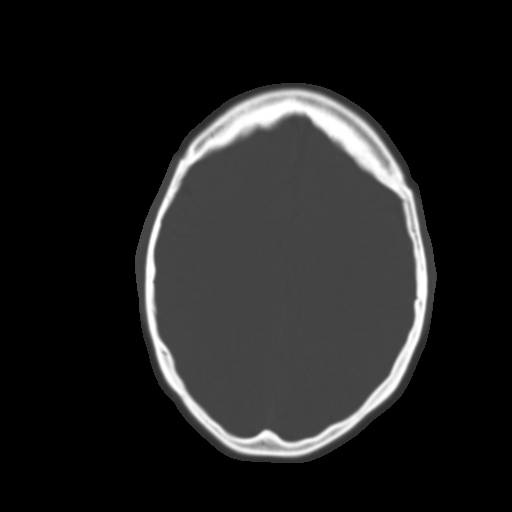
[im 19/32  brain]
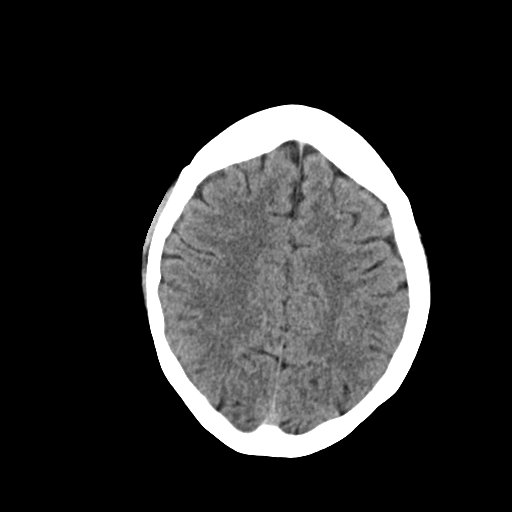
[im 21/32  brain]
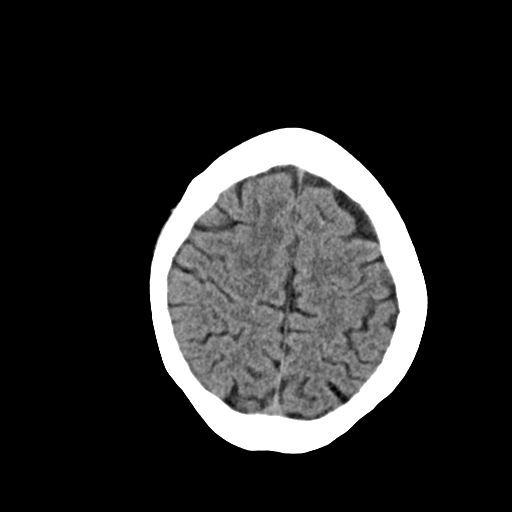
[im 23/32  brain]
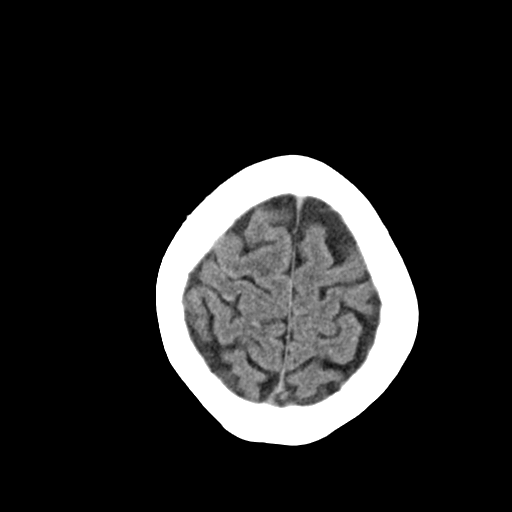
[im 24/32  brain]
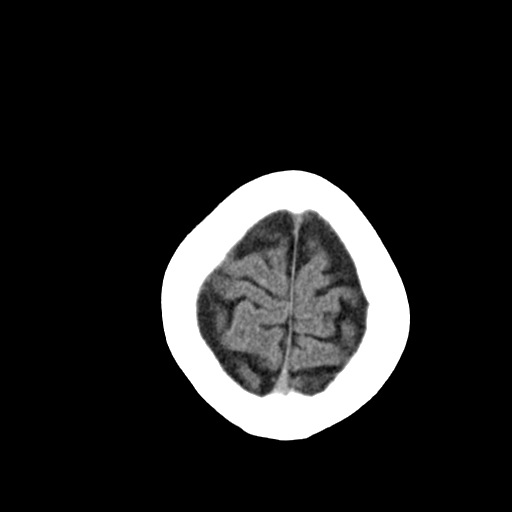
[im 24/32  bone]
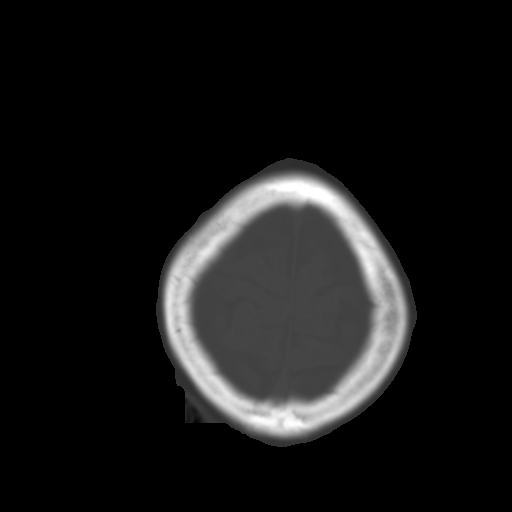
[im 26/32  brain]
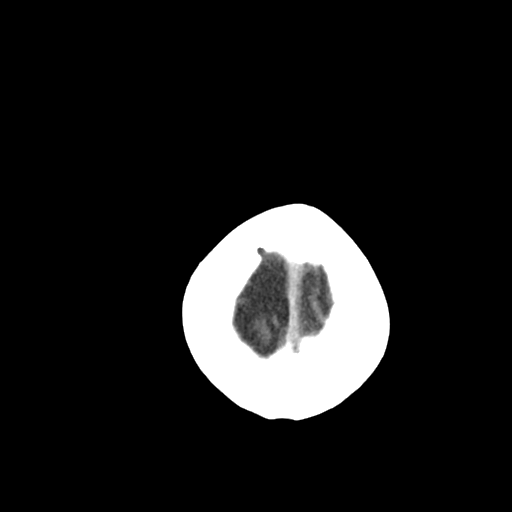
[im 28/32  brain]
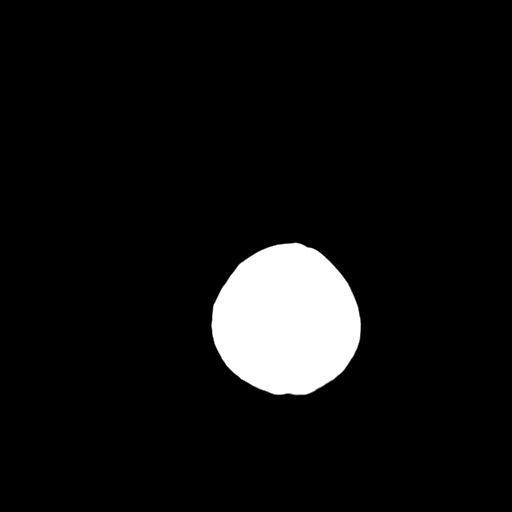
[im 30/32  brain]
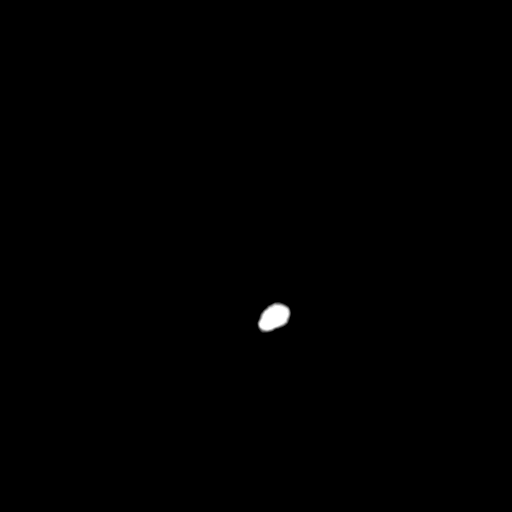

[16 of 30 positions shown; findings below may reference images not displayed]

FINDINGS: The ventricular system is normal in size and configuration, and the
septum is in a normal midline position. Mild atrophy of the
cerebellar folia is noted. No hemorrhage, mass lesion, or acute
infarction is seen. On bone window images, no calvarial abnormality
is noted.
IMPRESSION: Negative unenhanced CT of the brain.

## 2017-12-08 ENCOUNTER — Other Ambulatory Visit: Payer: Self-pay | Admitting: Neurology

## 2018-02-09 ENCOUNTER — Telehealth: Payer: Self-pay | Admitting: *Deleted

## 2018-02-09 NOTE — Telephone Encounter (Addendum)
Labs collected on 01/26/18:  Hgb A1C - 6.4% H  CBC without Diff: RBC - 4.12 L Hgb - 12.9 WNL (all other values normal)  CMP: Creatinine 0.89 WNL eGFR 68 WNL (all other values normal)  TSH 0.46 WNL  Vit D 50.5 WNL  CHOLESTEROL 171 WNL LDL 84 WNL  Dr. Krista Blue has reviewed his results and requested the above findings to be documented.  Printed copy sent for scanning to chart.

## 2018-04-18 IMAGING — DX DG FOOT COMPLETE 3+V*R*
3 series · 3 of 3 positions shown · non-contrast
Comparison: None.

CLINICAL DATA: Pain laterally

EXAM:
RIGHT FOOT COMPLETE - 3+ VIEW

[foot ap]
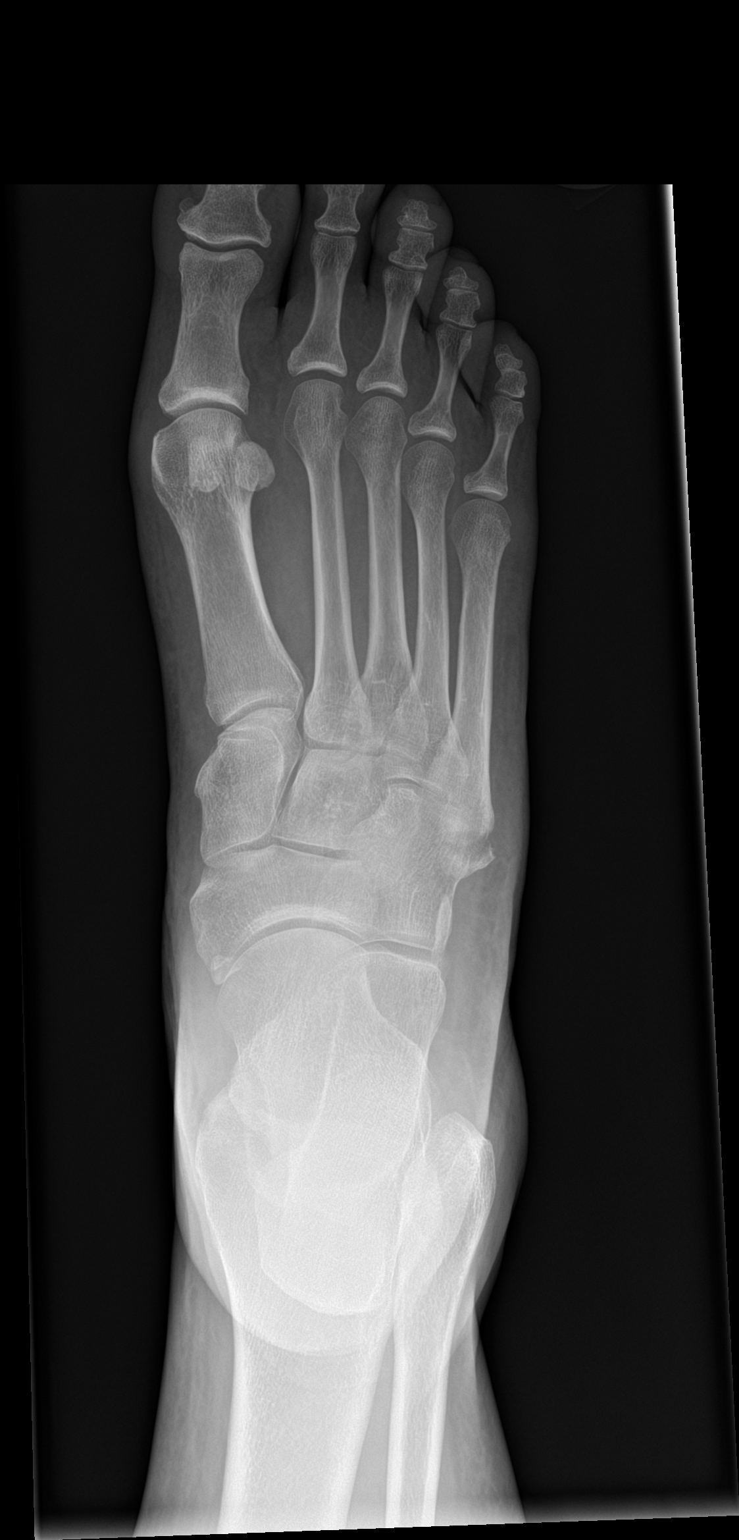

[foot obl]
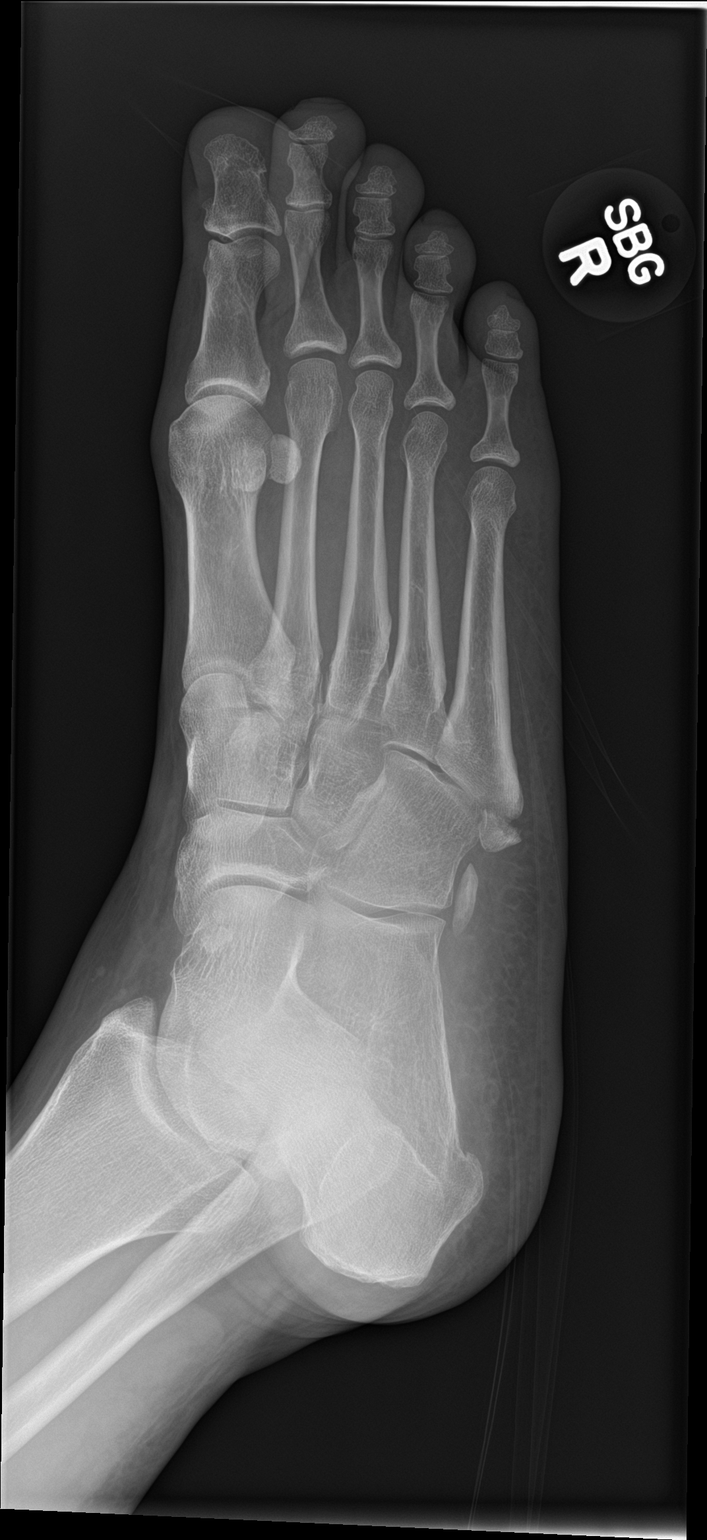

[foot lat]
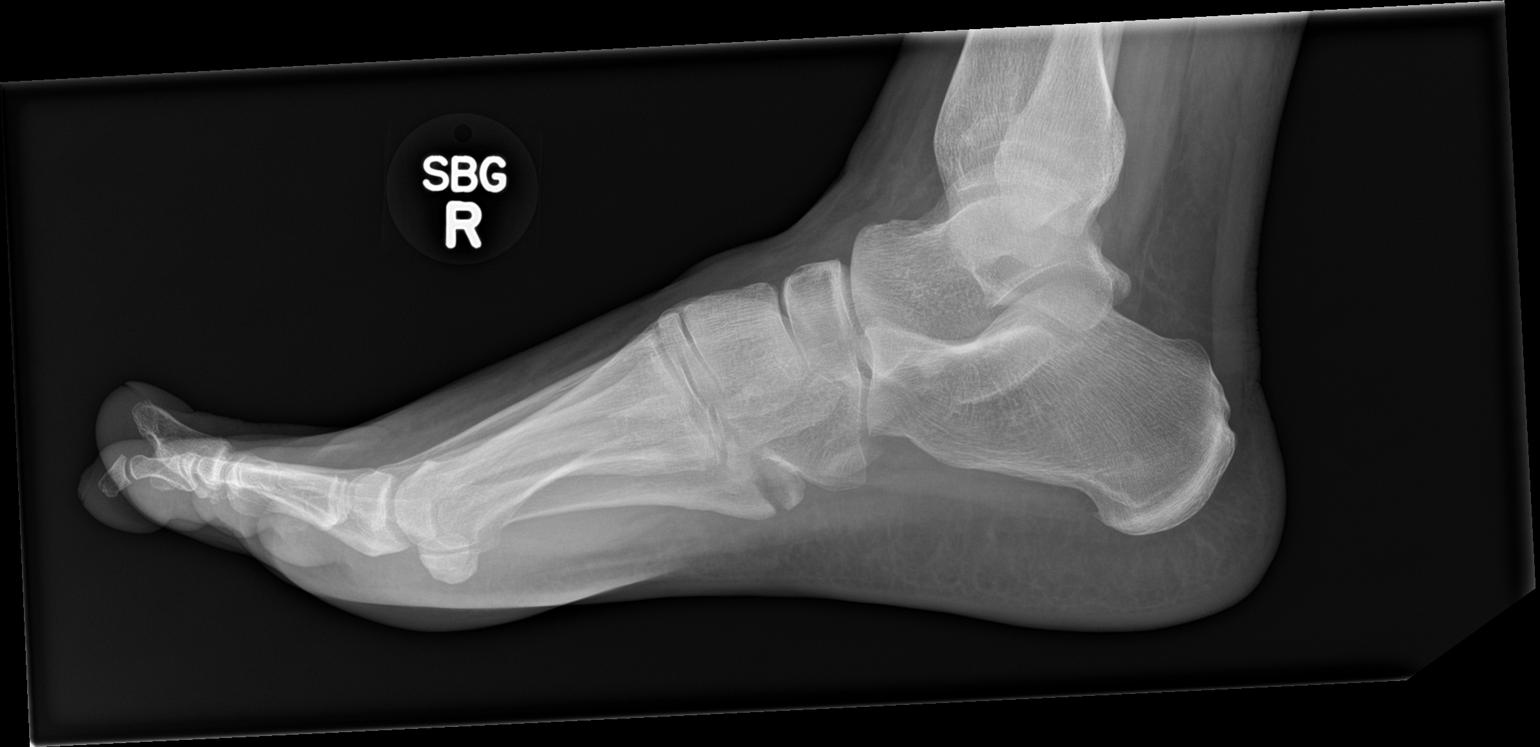

[3 of 3 positions shown; findings below may reference images not displayed]

FINDINGS: There is a slightly distracted fracture through the base of the
right fifth metatarsal with adjacent soft tissue swelling.
Tarsal-metatarsal alignment is normal. Joint spaces appear normal.
IMPRESSION: Slightly distracted fracture through the base of the right fifth
metatarsal
